# Patient Record
Sex: Female | Born: 1960 | Race: White | Hispanic: No | Marital: Single | Smoking: Former smoker
Health system: Southern US, Community
[De-identification: ages and names within clinical notes are randomized; demographics above are authoritative.]

## PROBLEM LIST (undated history)

## (undated) DIAGNOSIS — K200 Eosinophilic esophagitis: Secondary | ICD-10-CM

## (undated) DIAGNOSIS — E785 Hyperlipidemia, unspecified: Secondary | ICD-10-CM

## (undated) DIAGNOSIS — E538 Deficiency of other specified B group vitamins: Secondary | ICD-10-CM

## (undated) DIAGNOSIS — D510 Vitamin B12 deficiency anemia due to intrinsic factor deficiency: Secondary | ICD-10-CM

## (undated) DIAGNOSIS — E559 Vitamin D deficiency, unspecified: Secondary | ICD-10-CM

## (undated) DIAGNOSIS — Z8639 Personal history of other endocrine, nutritional and metabolic disease: Secondary | ICD-10-CM

## (undated) DIAGNOSIS — E063 Autoimmune thyroiditis: Secondary | ICD-10-CM

## (undated) DIAGNOSIS — Z1589 Genetic susceptibility to other disease: Secondary | ICD-10-CM

## (undated) DIAGNOSIS — K209 Esophagitis, unspecified without bleeding: Secondary | ICD-10-CM

## (undated) DIAGNOSIS — E618 Deficiency of other specified nutrient elements: Secondary | ICD-10-CM

## (undated) DIAGNOSIS — G43909 Migraine, unspecified, not intractable, without status migrainosus: Secondary | ICD-10-CM

## (undated) DIAGNOSIS — R87619 Unspecified abnormal cytological findings in specimens from cervix uteri: Secondary | ICD-10-CM

## (undated) HISTORY — DX: Personal history of other endocrine, nutritional and metabolic disease: Z86.39

## (undated) HISTORY — PX: TONSILLECTOMY AND ADENOIDECTOMY: SHX28

## (undated) HISTORY — DX: Deficiency of other specified nutrient elements: E61.8

## (undated) HISTORY — DX: Autoimmune thyroiditis: E06.3

## (undated) HISTORY — DX: Vitamin D deficiency, unspecified: E55.9

## (undated) HISTORY — DX: Deficiency of other specified B group vitamins: E53.8

## (undated) HISTORY — DX: Esophagitis, unspecified without bleeding: K20.90

## (undated) HISTORY — DX: Unspecified abnormal cytological findings in specimens from cervix uteri: R87.619

## (undated) HISTORY — DX: Eosinophilic esophagitis: K20.0

## (undated) HISTORY — DX: Genetic susceptibility to other disease: Z15.89

## (undated) HISTORY — DX: Migraine, unspecified, not intractable, without status migrainosus: G43.909

## (undated) HISTORY — DX: Vitamin B12 deficiency anemia due to intrinsic factor deficiency: D51.0

## (undated) HISTORY — PX: CATARACT EXTRACTION: SUR2

## (undated) HISTORY — DX: Hyperlipidemia, unspecified: E78.5

## (undated) HISTORY — PX: TONSILLECTOMY: SUR1361

---

## 2009-10-03 DIAGNOSIS — E063 Autoimmune thyroiditis: Secondary | ICD-10-CM | POA: Insufficient documentation

## 2017-06-05 DIAGNOSIS — G43009 Migraine without aura, not intractable, without status migrainosus: Secondary | ICD-10-CM | POA: Insufficient documentation

## 2020-08-24 ENCOUNTER — Other Ambulatory Visit: Payer: Self-pay

## 2020-08-24 ENCOUNTER — Ambulatory Visit (INDEPENDENT_AMBULATORY_CARE_PROVIDER_SITE_OTHER): Payer: 59 | Admitting: Obstetrics & Gynecology

## 2020-08-24 ENCOUNTER — Encounter: Payer: Self-pay | Admitting: Obstetrics & Gynecology

## 2020-08-24 ENCOUNTER — Other Ambulatory Visit (HOSPITAL_COMMUNITY)
Admission: RE | Admit: 2020-08-24 | Discharge: 2020-08-24 | Disposition: A | Discharge status: Home / Self Care | Payer: 59 | Source: Ambulatory Visit | Attending: Obstetrics & Gynecology | Admitting: Obstetrics & Gynecology

## 2020-08-24 VITALS — BP 89/58 | HR 68 | Ht 65.0 in | Wt 141.2 lb

## 2020-08-24 DIAGNOSIS — Z01419 Encounter for gynecological examination (general) (routine) without abnormal findings: Secondary | ICD-10-CM | POA: Diagnosis present

## 2020-08-24 DIAGNOSIS — Z1231 Encounter for screening mammogram for malignant neoplasm of breast: Secondary | ICD-10-CM | POA: Diagnosis not present

## 2020-08-24 NOTE — Progress Notes (Signed)
GYNECOLOGY ANNUAL PREVENTATIVE CARE ENCOUNTER NOTE  History:     Laura Byrd is a 60 y.o. [id number] female here for a routine annual gynecologic exam and to establish care. Just moved here from [place]. Current complaints: none.   Denies abnormal vaginal bleeding, discharge, pelvic pain, problems with intercourse or other gynecologic concerns.    Gynecologic History No LMP recorded. Patient is postmenopausal. LMP 08/12/2017.  Last Pap: 10/10/2019. Results were: normal with negative HPV. Desires repeat pap today, has different insurance. Last mammogram: 02/03/2020. Results were: normal  Obstetric History OB History  Gravida Para Term Preterm AB Living  1       1    SAB IAB Ectopic Multiple Live Births  1            # Outcome Date GA Lbr Len/2nd Weight Sex Delivery Anes PTL Lv  1 SAB             Past Medical History:  Diagnosis Date  . Abnormal Pap smear of cervix    Had two abnormal paps in 2015 and 2019, normal since  . Hashimoto's thyroiditis   . Migraines     Past Surgical History:  Procedure Laterality Date  . TONSILLECTOMY AND ADENOIDECTOMY      Current Outpatient Medications on File Prior to Visit  Medication Sig Dispense Refill  . levothyroxine (SYNTHROID) 100 MCG tablet Take 100 mcg by mouth daily before breakfast.    . SUMAtriptan (IMITREX) 50 MG tablet Take 50 mg by mouth every 2 (two) hours as needed for migraine. May repeat in 2 hours if headache persists or recurs.     No current facility-administered medications on file prior to visit.    Allergies  Allergen Reactions  . Penicillins Hives    Social History:  reports that she has quit smoking. She has never used smokeless tobacco. She reports previous alcohol use. She reports previous drug use.  Family History  Problem Relation Age of Onset  . Breast cancer Sister     The following portions of the patient's history were reviewed and updated as appropriate: allergies, current medications, past family  history, past medical history, past social history, past surgical history and problem list.  Review of Systems Pertinent items noted in HPI and remainder of comprehensive ROS otherwise negative.  Physical Exam:  BP (!) 89/58   Pulse 68   Ht 5\' 5"  (1.651 m)   Wt 141 lb 3.2 oz (64 kg)   BMI 23.50 kg/m  CONSTITUTIONAL: Well-developed, well-nourished female in no acute distress.  HENT:  Normocephalic, atraumatic, External right and left ear normal.  EYES: Conjunctivae and EOM are normal. Pupils are equal, round, and reactive to light. No scleral icterus.  NECK: Normal range of motion, supple, no masses.  Normal thyroid.  SKIN: Skin is warm and dry. No rash noted. Not diaphoretic. No erythema. No pallor. MUSCULOSKELETAL: Normal range of motion. No tenderness.  No cyanosis, clubbing, or edema. NEUROLOGIC: Alert and oriented to person, place, and time. Normal reflexes, muscle tone coordination.  PSYCHIATRIC: Normal mood and affect. Normal behavior. Normal judgment and thought content. CARDIOVASCULAR: Normal heart rate noted, regular rhythm RESPIRATORY: Clear to auscultation bilaterally. Effort and breath sounds normal, no problems with respiration noted. BREASTS: Symmetric in size. No masses, tenderness, skin changes, nipple drainage, or lymphadenopathy bilaterally. Performed in the presence of a chaperone. ABDOMEN: Soft, no distention noted.  No tenderness, rebound or guarding.  PELVIC: Normal appearing external genitalia and urethral meatus with moderate  atrophy; atrophic appearing vaginal mucosa and cervix. Cervix with prominent ectropion.  No abnormal discharge noted.  Pap smear obtained.  Normal uterine size, no other palpable masses, no uterine or adnexal tenderness.  Performed in the presence of a chaperone.   Assessment and Plan:    1. Well woman exam with routine gynecological exam - Cytology - PAP Will follow up results of pap smear and manage accordingly.  2. Breast cancer  screening by mammogram - MM 3D SCREEN BREAST BILATERAL; Future Mammogram is up to date, patient to schedule next one via MyChart after 02/2021 Routine preventative health maintenance measures emphasized. Please refer to After Visit Summary for other counseling recommendations.      Jaynie Collins, MD, FACOG Obstetrician & Gynecologist, [facility] for [employer], [facility]

## 2020-08-24 NOTE — Patient Instructions (Signed)
Preventive Care 59-59 Years Old, Female Preventive care refers to lifestyle choices and visits with your health care provider that can promote health and wellness. This includes:  A yearly physical exam. This is also called an annual wellness visit.  Regular dental and eye exams.  Immunizations.  Screening for certain conditions.  Healthy lifestyle choices, such as: ? Eating a healthy diet. ? Getting regular exercise. ? Not using drugs or products that contain nicotine and tobacco. ? Limiting alcohol use. What can I expect for my preventive care visit? Physical exam Your health care provider will check your:  Height and weight. These may be used to calculate your BMI (body mass index). BMI is a measurement that tells if you are at a healthy weight.  Heart rate and blood pressure.  Body temperature.  Skin for abnormal spots. Counseling Your health care provider may ask you questions about your:  Past medical problems.  Family's medical history.  Alcohol, tobacco, and drug use.  Emotional well-being.  Home life and relationship well-being.  Sexual activity.  Diet, exercise, and sleep habits.  Work and work Statistician.  Access to firearms.  Method of birth control.  Menstrual cycle.  Pregnancy history. What immunizations do I need? Vaccines are usually given at various ages, according to a schedule. Your health care provider will recommend vaccines for you based on your age, medical history, and lifestyle or other factors, such as travel or where you work.   What tests do I need? Blood tests  Lipid and cholesterol levels. These may be checked every 5 years, or more often if you are over 3 years old.  Hepatitis C test.  Hepatitis B test. Screening  Lung cancer screening. You may have this screening every year starting at age 73 if you have a 30-pack-year history of smoking and currently smoke or have quit within the past 15 years.  Colorectal cancer  screening. ? All adults should have this screening starting at age 52 and continuing until age 17. ? Your health care provider may recommend screening at age 49 if you are at increased risk. ? You will have tests every 1-10 years, depending on your results and the type of screening test.  Diabetes screening. ? This is done by checking your blood sugar (glucose) after you have not eaten for a while (fasting). ? You may have this done every 1-3 years.  Mammogram. ? This may be done every 1-2 years. ? Talk with your health care provider about when you should start having regular mammograms. This may depend on whether you have a family history of breast cancer.  BRCA-related cancer screening. This may be done if you have a family history of breast, ovarian, tubal, or peritoneal cancers.  Pelvic exam and Pap test. ? This may be done every 3 years starting at age 10. ? Starting at age 11, this may be done every 5 years if you have a Pap test in combination with an HPV test. Other tests  STD (sexually transmitted disease) testing, if you are at risk.  Bone density scan. This is done to screen for osteoporosis. You may have this scan if you are at high risk for osteoporosis. Talk with your health care provider about your test results, treatment options, and if necessary, the need for more tests. Follow these instructions at home: Eating and drinking  Eat a diet that includes fresh fruits and vegetables, whole grains, lean protein, and low-fat dairy products.  Take vitamin and mineral supplements  as recommended by your health care provider.  Do not drink alcohol if: ? Your health care provider tells you not to drink. ? You are pregnant, may be pregnant, or are planning to become pregnant.  If you drink alcohol: ? Limit how much you have to 0-1 drink a day. ? Be aware of how much alcohol is in your drink. In the [place] one drink equals one 12 oz bottle of beer (355 mL), one 5 oz glass of  wine (148 mL), or one 1 oz glass of hard liquor (44 mL).   Lifestyle  Take daily care of your teeth and gums. Brush your teeth every morning and night with fluoride toothpaste. Floss one time each day.  Stay active. Exercise for at least 30 minutes 5 or more days each week.  Do not use any products that contain nicotine or tobacco, such as cigarettes, e-cigarettes, and chewing tobacco. If you need help quitting, ask your health care provider.  Do not use drugs.  If you are sexually active, practice safe sex. Use a condom or other form of protection to prevent STIs (sexually transmitted infections).  If you do not wish to become pregnant, use a form of birth control. If you plan to become pregnant, see your health care provider for a prepregnancy visit.  If told by your health care provider, take low-dose aspirin daily starting at age 50.  Find healthy ways to cope with stress, such as: ? Meditation, yoga, or listening to music. ? Journaling. ? Talking to a trusted person. ? Spending time with friends and family. Safety  Always wear your seat belt while driving or riding in a vehicle.  Do not drive: ? If you have been drinking alcohol. Do not ride with someone who has been drinking. ? When you are tired or distracted. ? While texting.  Wear a helmet and other protective equipment during sports activities.  If you have firearms in your house, make sure you follow all gun safety procedures. What's next?  Visit your health care provider once a year for an annual wellness visit.  Ask your health care provider how often you should have your eyes and teeth checked.  Stay up to date on all vaccines. This information is not intended to replace advice given to you by your health care provider. Make sure you discuss any questions you have with your health care provider. Document Revised: 01/24/2020 Document Reviewed: 12/31/2017 Elsevier Patient Education  2021 Elsevier Inc.  

## 2020-08-30 LAB — CYTOLOGY - PAP
Comment: NEGATIVE
Diagnosis: NEGATIVE
High risk HPV: NEGATIVE

## 2020-09-10 ENCOUNTER — Other Ambulatory Visit: Payer: Self-pay | Admitting: Obstetrics & Gynecology

## 2020-09-10 DIAGNOSIS — Z1231 Encounter for screening mammogram for malignant neoplasm of breast: Secondary | ICD-10-CM

## 2021-01-25 DIAGNOSIS — Z1231 Encounter for screening mammogram for malignant neoplasm of breast: Secondary | ICD-10-CM

## 2021-02-08 ENCOUNTER — Ambulatory Visit
Admission: RE | Admit: 2021-02-08 | Discharge: 2021-02-08 | Disposition: A | Discharge status: Home / Self Care | Payer: 59 | Source: Ambulatory Visit

## 2021-02-08 ENCOUNTER — Other Ambulatory Visit: Payer: Self-pay

## 2021-02-08 DIAGNOSIS — Z1231 Encounter for screening mammogram for malignant neoplasm of breast: Secondary | ICD-10-CM

## 2021-05-10 DIAGNOSIS — L578 Other skin changes due to chronic exposure to nonionizing radiation: Secondary | ICD-10-CM | POA: Diagnosis not present

## 2021-05-10 DIAGNOSIS — L400 Psoriasis vulgaris: Secondary | ICD-10-CM | POA: Diagnosis not present

## 2021-05-10 DIAGNOSIS — L814 Other melanin hyperpigmentation: Secondary | ICD-10-CM | POA: Diagnosis not present

## 2021-05-10 DIAGNOSIS — D225 Melanocytic nevi of trunk: Secondary | ICD-10-CM | POA: Diagnosis not present

## 2021-05-10 DIAGNOSIS — D485 Neoplasm of uncertain behavior of skin: Secondary | ICD-10-CM | POA: Diagnosis not present

## 2021-06-21 DIAGNOSIS — E785 Hyperlipidemia, unspecified: Secondary | ICD-10-CM | POA: Diagnosis not present

## 2021-06-21 DIAGNOSIS — Z6822 Body mass index (BMI) 22.0-22.9, adult: Secondary | ICD-10-CM | POA: Diagnosis not present

## 2021-06-21 DIAGNOSIS — Z79899 Other long term (current) drug therapy: Secondary | ICD-10-CM | POA: Diagnosis not present

## 2021-06-21 DIAGNOSIS — G43909 Migraine, unspecified, not intractable, without status migrainosus: Secondary | ICD-10-CM | POA: Diagnosis not present

## 2021-06-21 DIAGNOSIS — E559 Vitamin D deficiency, unspecified: Secondary | ICD-10-CM | POA: Diagnosis not present

## 2021-06-21 DIAGNOSIS — E039 Hypothyroidism, unspecified: Secondary | ICD-10-CM | POA: Diagnosis not present

## 2021-08-30 ENCOUNTER — Other Ambulatory Visit (HOSPITAL_COMMUNITY)
Admission: RE | Admit: 2021-08-30 | Discharge: 2021-08-30 | Disposition: A | Discharge status: Home / Self Care | Payer: 59 | Source: Ambulatory Visit

## 2021-08-30 ENCOUNTER — Ambulatory Visit (INDEPENDENT_AMBULATORY_CARE_PROVIDER_SITE_OTHER): Payer: 59

## 2021-08-30 VITALS — BP 100/61 | HR 74 | Ht 65.0 in | Wt 134.3 lb

## 2021-08-30 DIAGNOSIS — N952 Postmenopausal atrophic vaginitis: Secondary | ICD-10-CM

## 2021-08-30 DIAGNOSIS — Z01419 Encounter for gynecological examination (general) (routine) without abnormal findings: Secondary | ICD-10-CM

## 2021-08-30 DIAGNOSIS — Z1239 Encounter for other screening for malignant neoplasm of breast: Secondary | ICD-10-CM | POA: Diagnosis not present

## 2021-08-30 DIAGNOSIS — Z124 Encounter for screening for malignant neoplasm of cervix: Secondary | ICD-10-CM | POA: Insufficient documentation

## 2021-08-30 NOTE — Progress Notes (Signed)
? ? ?GYNECOLOGY OFFICE VISIT NOTE-WELL WOMAN EXAM ? ?History:  ? ALDONIA Byrd [id number] here today for annual exam. She is postmenopausal with LMP April 2019. She is not currently sexually active and denies any abnormal vaginal discharge, bleeding, or pelvic pain.  She reports some urinary urgency, but feels she is coping well as it does not interfere into her daily life.  She denies other issues with urination, constipation, or diarrhea..  Patient reports completing breast exams, but not regularly.  She denies current issues with her breast.  She does endorse family history as her sister had breast cancer. ? ?Laura Byrd reports daily exercise in the form of gardening and landscaping.  She also does yoga and walks her dog.  She denies current usage of tobacco, alcohol, or drugs.  She further states that alcohol usage gives her migraines.  ? ?Laura Byrd reports medical history significant for Hashimoto's, Migraines, and Esophagitis. She is currently being followed by her PCP, O'Buch, and was seen in Feb with her next appt in July. ?She endorses restricted, but overall balanced diet.  She reports she does not eat fish, meats, gluten, dairy, soy, treenuts, or eggs. ? 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  She endorse safety and has a "small, but solid" support system.  ? ?Past Medical History:  ?Diagnosis Date  ? Abnormal Pap smear of cervix   ? Had two abnormal paps in 2015 and 2019, normal since  ? Hashimoto's thyroiditis   ? Migraines   ? ? ?Past Surgical History:  ?Procedure Laterality Date  ? TONSILLECTOMY AND ADENOIDECTOMY    ? ? ?The following portions of the patient's history were reviewed and updated as appropriate: allergies, current medications, past family history, past medical history, past social history, past surgical history and problem list.  ? ?Health Maintenance:  Normal pap and negative HRHPV on April 2022.  Normal mammogram in Oct 2022.  ? ?Review of Systems:   ?Pertinent items noted in HPI and remainder of comprehensive ROS otherwise negative.   ? ?Objective:  ?  ?Physical Exam ?BP 100/61   Pulse 74   Ht 5\' 5"  (1.651 m)   Wt 134 lb 4.8 oz (60.9 kg)   BMI 22.35 kg/m?  ?Physical Exam ?Exam conducted with a chaperone present.  ?Constitutional:   ?   Appearance: Normal appearance.  ?HENT:  ?   Head: Normocephalic and atraumatic.  ?Eyes:  ?   Conjunctiva/sclera: Conjunctivae normal.  ?Cardiovascular:  ?   Rate and Rhythm: Normal rate and regular rhythm.  ?Pulmonary:  ?   Effort: Pulmonary effort is normal. No respiratory distress.  ?   Breath sounds: Normal breath sounds.  ?Chest:  ?Breasts: ?   Right: No mass or nipple discharge.  ?   Left: Normal.  ?   Comments: CBE complete. Patient reports sensitivity in her right breast near the nipple.  ?Abdominal:  ?   General: Bowel sounds are normal.  ?   Palpations: Abdomen is soft.  ?   Tenderness: There is no abdominal tenderness.  ?Genitourinary: ?   Labia:     ?   Right: No tenderness or lesion.     ?   Left: No tenderness or lesion.   ?   Vagina: No vaginal discharge, erythema or bleeding.  ?   Cervix: No discharge or friability.  ?   Comments: NEFG  ?Speculum exam: Vaginal walls smooth, light pink/white, and without lesions/bleeding. ?Cervix appears pinkish white. No discharge.  Pap collected with brush and spatula. Os appears stenotic.  ?Musculoskeletal:     ?   General: Normal range of motion.  ?   Cervical back: Normal range of motion.  ?Skin: ?   General: Skin is warm and dry.  ?Neurological:  ?   Mental Status: She is alert and oriented to person, place, and time.  ?Psychiatric:     ?   Mood and Affect: Mood normal.     ?   Behavior: Behavior normal.  ?  ? ?Labs and Imaging ?No results found for this or any previous visit (from the past 168 hour(s)). ?No results found. ?  ?Assessment & Plan:  ?61 year old Female ?Well Woman Exam ?Desires Pap Smear d/t remote H/O abnormal cytology ?Breast Exam ? ?1. Well woman exam with  routine gynecological exam ?-Exam performed and findings discussed. ?-Encouraged to utilize Mychart for reviewing of results, communication with office, and scheduling of appts. ?-Educated on AHA exercise recommendations of 30 minutes of moderate to vigorous activity at least 5x/week. ? ?2. Pap smear for cervical cancer screening ?-Educated on ASCCP guidelines regarding pap smear evaluation and frequency. ?-Informed that she does not require repeat testing today.  ?-Patient expresses desire for repeat pap despite guidelines reporting history of abnormal cytology in the past. ?-Informed that provider understands and cytology can be repeated today.   ?-Pap with cotest completed ?-Informed of turnover time and provider/clinic policy on releasing results. ? ?3. Vaginal atrophy ?-Discussed vaginal atrophy  ?-Reassured that normal findings in postmenopausal women.  ?-Informed no treatment unless symptoms, such as, pain or discomfort with sexual activity arise.  ? ?3. Encounter for screening breast examination ?-Educated and encouraged to continue SBE with increased breast awareness including examination of breast for skin changes, moles, tenderness, etc.  ?-Order placed for mammogram to occur in October. ?-Encouraged to monitor area of sensitivity and report if changes occur.  ? ?Routine preventative health maintenance measures emphasized. ?Please refer to After Visit Summary for other counseling recommendations.  ? ?No follow-ups on file. ?  ?  ? ?Laura Byrd, CNM ?08/30/2021  ? ? ? ? ?

## 2021-08-30 NOTE — Progress Notes (Signed)
Mammo: 02/08/2021 ?

## 2021-09-03 LAB — CYTOLOGY - PAP
Comment: NEGATIVE
Diagnosis: NEGATIVE
High risk HPV: NEGATIVE

## 2021-10-25 DIAGNOSIS — D225 Melanocytic nevi of trunk: Secondary | ICD-10-CM | POA: Diagnosis not present

## 2021-10-25 DIAGNOSIS — L400 Psoriasis vulgaris: Secondary | ICD-10-CM | POA: Diagnosis not present

## 2021-10-25 DIAGNOSIS — D485 Neoplasm of uncertain behavior of skin: Secondary | ICD-10-CM | POA: Diagnosis not present

## 2021-12-08 DIAGNOSIS — Z681 Body mass index (BMI) 19 or less, adult: Secondary | ICD-10-CM | POA: Diagnosis not present

## 2021-12-08 DIAGNOSIS — R21 Rash and other nonspecific skin eruption: Secondary | ICD-10-CM | POA: Diagnosis not present

## 2021-12-17 DIAGNOSIS — E538 Deficiency of other specified B group vitamins: Secondary | ICD-10-CM | POA: Diagnosis not present

## 2021-12-17 DIAGNOSIS — Z6821 Body mass index (BMI) 21.0-21.9, adult: Secondary | ICD-10-CM | POA: Diagnosis not present

## 2021-12-17 DIAGNOSIS — E559 Vitamin D deficiency, unspecified: Secondary | ICD-10-CM | POA: Diagnosis not present

## 2021-12-17 DIAGNOSIS — E039 Hypothyroidism, unspecified: Secondary | ICD-10-CM | POA: Diagnosis not present

## 2021-12-17 DIAGNOSIS — Z0000 Encounter for general adult medical examination without abnormal findings: Secondary | ICD-10-CM | POA: Diagnosis not present

## 2021-12-18 ENCOUNTER — Other Ambulatory Visit: Payer: Self-pay | Admitting: Internal Medicine

## 2021-12-18 DIAGNOSIS — Z13820 Encounter for screening for osteoporosis: Secondary | ICD-10-CM

## 2022-01-16 ENCOUNTER — Encounter: Payer: Self-pay | Admitting: *Deleted

## 2022-01-17 ENCOUNTER — Ambulatory Visit: Payer: 59 | Admitting: Psychiatry

## 2022-01-17 ENCOUNTER — Encounter: Payer: Self-pay | Admitting: Psychiatry

## 2022-01-17 VITALS — BP 96/60 | HR 72 | Ht 65.0 in | Wt 129.0 lb

## 2022-01-17 DIAGNOSIS — G43019 Migraine without aura, intractable, without status migrainosus: Secondary | ICD-10-CM

## 2022-01-17 MED ORDER — UBRELVY 100 MG PO TABS: 100.0000 mg | ORAL_TABLET | ORAL | 0 refills | Status: DC | PRN

## 2022-01-17 NOTE — Progress Notes (Signed)
Referring:  Eunice Blase, PA-C [address] [place],  [place] [address]  PCP: Eunice Blase, PA-C  Neurology was asked to evaluate Laura Byrd, a 61 year old female for a chief complaint of headaches.  Our recommendations of care will be communicated by shared medical record.    CC:  headaches  History provided from self  HPI:  Medical co-morbidities: Hashimoto's, B12 deficiency, Fuch's disease  The patient presents for evaluation of headaches which began in 2011. She averages 1-2 migraines per week. They are described as right retro-orbital aching with associated photophobia, phonophobia, and nausea. They can last 3-4 days without medication. Takes Imitrex 100 mg PRN for rescue, which helps but she will sometimes run out of medication. She will sometimes have to repeat a dose after 2 hours. Takes gabapentin 100 mg TID for headache prevention.  Headache History: Onset: 2011 Triggers: alcohol, dairy, soy, hard candy, lack of sleep, weather changes Aura: no Location: right retro-orbital Quality/Description: aching Associated Symptoms:  Photophobia: yes  Phonophobia: yes  Nausea: yes Vomiting: yes Worse with activity?: yes Duration of headaches: 3-4 days  Headache days per month: 8 Headache free days per month: 22  Current Treatment: Abortive Imitrex 100 mg PRN  Preventative Gabapentin 100 mg TID  Prior Therapies                                 Gabapentin 100 mg TID Imitrex 100 mg PRN Zomig 5 mg PRN   LABS: none  IMAGING:  Reportedly had normal MRI brain in 2019  Current Outpatient Medications on File Prior to Visit  Medication Sig Dispense Refill   gabapentin (NEURONTIN) 100 MG capsule Take 100 mg by mouth 3 (three) times daily.     levothyroxine (SYNTHROID) 50 MCG tablet Take 25-50 mcg by mouth daily. Takes 1 tablet on 1 day and 1/2 tablet on other 6 days of the week     liothyronine (CYTOMEL) 5 MCG tablet Take 5-10 mcg by mouth daily. 7.5 mcg  on 4 days a week and 5 mcg on 3 days of the week     SUMAtriptan (IMITREX) 100 MG tablet Take 100 mg by mouth as directed.     No current facility-administered medications on file prior to visit.     Allergies: Allergies  Allergen Reactions   Other     Milk, nuts, egg white, wheat   Penicillins Hives    Family History: Migraine or other headaches in the family:  niece Aneurysms in a first degree relative:  no Brain tumors in the family:  no Other neurological illness in the family:   lewy body dementia  Past Medical History: Past Medical History:  Diagnosis Date   Abnormal Pap smear of cervix    Had two abnormal paps in 2015 and 2019, normal since   B12 deficiency    Dyslipidemia    Eosinophilic esophagitis    Hashimoto's thyroiditis    History of vitamin D deficiency    Iodine deficiency    Migraines    MTHFR mutation    Pernicious anemia    Vitamin D3 deficiency     Past Surgical History Past Surgical History:  Procedure Laterality Date   CATARACT EXTRACTION     TONSILLECTOMY     TONSILLECTOMY AND ADENOIDECTOMY     pt doesn't think her adenoids were removed    Social History: Social History   Tobacco Use  Smoking status: Former    Types: Cigarettes    Quit date: 05/06/1987    Years since quitting: 34.7   Smokeless tobacco: Never  Vaping Use   Vaping Use: Never used  Substance Use Topics   Alcohol use: Not Currently   Drug use: Never    ROS: Negative for fevers, chills. Positive for headaches. All other systems reviewed and negative unless stated otherwise in HPI.   Physical Exam:   Vital Signs: BP 96/60 (BP Location: Right Arm, Patient Position: Sitting)   Pulse 72   Ht 5\' 5"  (1.651 m)   Wt 129 lb (58.5 kg)   BMI 21.47 kg/m  GENERAL: well appearing,in no acute distress,alert SKIN:  Color, texture, turgor normal. No rashes or lesions HEAD:  Normocephalic/atraumatic. CV:  RRR RESP: Normal respiratory effort MSK: no tenderness to  palpation over occiput, neck, or shoulders  NEUROLOGICAL: Mental Status: Alert, oriented to person, place and time,Follows commands Cranial Nerves: PERRL, visual fields intact to confrontation, extraocular movements intact, facial sensation intact, no facial droop or ptosis, hearing grossly intact, no dysarthria Motor: muscle strength 5/5 both upper and lower extremities,no drift, normal tone Reflexes: 2+ throughout Sensation: intact to light touch all 4 extremities Coordination: Finger-to- nose-finger intact bilaterally Gait: normal-based   IMPRESSION: 61 year old female with a history of Hashimoto's, B12 deficiency, Fuch's disease who present for evaluation of migraines. Imitrex helps for rescue, but she has found herself having to take a second dose more frequently. Ubrelvy sample provided today. If this is effective will send in a prescription. Discussed preventive options. She would like to try to wean off of gabapentin. Discussed supplement options for prevention (Mg, B2, CoQ10).  PLAN: -Prevention: Wean off gabapentin. Supplement information provided (Mg, B2, CoQ10) -Rescue: Continue Imitrex 100 mg PRN for now. Ubrelvy sample provided. Will send in Rx if this is effective   I spent a total of 32 minutes chart reviewing and counseling the patient. Headache education was done. Discussed treatment options including preventive and acute medications, and natural supplements. Discussed medication side effects, adverse reactions and drug interactions. Written educational materials and patient instructions outlining all of the above were given.  Follow-up: 6 months   68, MD 01/17/2022   10:25 AM

## 2022-01-17 NOTE — Patient Instructions (Addendum)
Try Bernita Raisin as needed for migraines. Take one pill at onset of migraine. May repeat a dose in 2 hours if headache persists.  Try weaning off gabapentin. Take 1 pill twice a day for one week, then 1 pill at bedtime for one week, then stop  GENERAL HEADACHE INSTRUCTIONS Headache Preventive Treatment: Please keep in mind that it takes 4-6 weeks for the medication to start working well and 2-3 months at the appropriate dose before deciding if it will be useful or not. If it is not helping at all by this time, then we will discuss other medications to try. Supplements may take 3-6 months until you see full effect.   Natural supplements: Magnesium Oxide or Magnesium Glycinate 500 mg at bed (up to 800 mg daily) Coenzyme Q10 300 mg in AM Vitamin B2- 200 mg twice a day  Add 1 supplement at a time since even natural supplements can have undesirable side effects. You can sometimes buy supplements cheaper (especially Coenzyme Q10) at www.WebmailGuide.co.za or at [employer].  Vitamins and herbs that show potential:  Magnesium: Magnesium (250 mg twice a day or 500 mg at bed) has a relaxant effect on smooth muscles such as blood vessels. Individuals suffering from frequent or daily headache usually have low magnesium levels which can be increase with daily supplementation of 400-750 mg. Three trials found 40-90% average headache reduction  when used as a preventative. Magnesium also demonstrated the benefit in menstrually related migraine.  Magnesium is part of the messenger system in the serotonin cascade and it is a good muscle relaxant.  It is also useful for constipation which can be a side effect of other medications used to treat migraine. Good sources include nuts, whole grains, and tomatoes. Side Effects: loose stool/diarrhea Riboflavin (vitamin B 2) 200 mg twice a day. This vitamin assists nerve cells in the production of ATP a principal energy storing molecule.  It is necessary for many chemical reactions in the  body.  There have been at least 3 clinical trials of riboflavin using 400 mg per day all of which suggested that migraine frequency can be decreased.  All 3 trials showed significant improvement in over half of migraine sufferers.  The supplement is found in bread, cereal, milk, meat, and poultry.  Most Americans get more riboflavin than the recommended daily allowance, however riboflavin deficiency is not necessary for the supplements to help prevent headache. Side effects: energizing, green urine  Coenzyme Q10: This is present in almost all cells in the body and is critical component for the conversion of energy.  Recent studies have shown that a nutritional supplement of CoQ10 can reduce the frequency of migraine attacks by improving the energy production of cells as with riboflavin.  Doses of 150 mg twice a day have been shown to be effective.   HEADACHE DIET: Foods and beverages which may trigger migraine Note that only 20% of headache patients are food sensitive. You will know if you are food sensitive if you get a headache consistently 20 minutes to 2 hours after eating a certain food. Only cut out a food if it causes headaches, otherwise you might remove foods you enjoy! What matters most for diet is to eat a well balanced healthy diet full of vegetables and low fat protein, and to not miss meals.  Chocolate, other sweets ALL cheeses except cottage and cream cheese Dairy products, yogurt, sour cream, ice cream Liver Meat extracts (Bovril, Marmite, meat tenderizers) Meats or fish which have undergone aging, fermenting,  pickling or smoking. These include: Hotdogs,salami,Lox,sausage, mortadellas,smoked salmon, pepperoni, Pickled herring Pods of broad bean (English beans, Chinese pea pods, [place] (fava) beans, lima and navy beans Ripe avocado, ripe banana Yeast extracts or active yeast preparations such as Brewer's or Fleishman's (commercial bakes goods are permitted) Tomato based foods, pizza  (lasagna, etc.)  MSG (monosodium glutamate) is disguised as many things; look for these common aliases: Monopotassium glutamate Autolysed yeast Hydrolysed protein Sodium caseinate "flavorings" "all natural preservatives" Nutrasweet  Avoid all other foods that convincingly provoke headaches.  Resources: The Dizzy Lu Duffel Your Headache Diet, migrainestrong.com  https://www.aguirre.org/  Caffeine and Migraine For patients that have migraine, caffeine intake more than 3 days per week can lead to dependency and increased migraine frequency. I would recommend cutting back on your caffeine intake as best you can. The recommended amount of caffeine is 200-300 mg daily, although migraine patients may experience dependency at even lower doses. While you may notice an increase in headache temporarily, cutting back will be helpful for headaches in the long run. For more information on caffeine and migraine, visit: https://americanmigrainefoundation.org/resource-library/caffeine-and-migraine/  Headache Prevention Strategies:  1. Maintain a headache diary; learn to identify and avoid triggers.  - This can be a simple note where you log when you had a headache, associated symptoms, and medications used - There are several smartphone apps developed to help track migraines: Migraine Buddy, Migraine Monitor, Curelator N1-Headache App  Common triggers include: Emotional triggers: Emotional/Upset family or friends Emotional/Upset occupation Business reversal/success Anticipation anxiety Crisis-serious Post-crisis periodNew job/position   Physical triggers: Vacation Day Weekend Strenuous Exercise High Altitude Location New Move Menstrual Day Physical Illness Oversleep/Not enough sleep Weather changes Light: Photophobia or light sesnitivity treatment involves a balance between desensitization and reduction in overly strong input. Use dark  polarized glasses outside, but not inside. Avoid bright or fluorescent light, but do not dim environment to the point that going into a normally lit room hurts. Consider FL-41 tint lenses, which reduce the most irritating wavelengths without blocking too much light.  These can be obtained at axonoptics.com or theraspecs.com Foods: see list above.  2. Limit use of acute treatments (over-the-counter medications, triptans, etc.) to no more than 2 days per week or 10 days per month to prevent medication overuse headache (rebound headache).    3. Follow a regular schedule (including weekends and holidays): Don't skip meals. Eat a balanced diet. 8 hours of sleep nightly. Minimize stress. Exercise 30 minutes per day. Being overweight is associated with a 5 times increased risk of chronic migraine. Keep well hydrated and drink 6-8 glasses of water per day.  4. Initiate non-pharmacologic measures at the earliest onset of your headache. Rest and quiet environment. Relax and reduce stress. Breathe2Relax is a free app that can instruct you on    some simple relaxtion and breathing techniques. Http://Dawnbuse.com is a    free website that provides teaching videos on relaxation.  Also, there are  many apps that   can be downloaded for "mindful" relaxation.  An app called YOGA NIDRA will help walk you through mindfulness. Another app called Calm can be downloaded to give you a structured mindfulness guide with daily reminders and skill development. Headspace for guided meditation Mindfulness Based Stress Reduction Online Course: www.palousemindfulness.com Cold compresses.  5. Don't wait!! Take the maximum allowable dosage of prescribed medication at the first sign of migraine.  6. Compliance:  Take prescribed medication regularly as directed and at the first sign of a migraine.  7. Communicate:  Call your physician when problems arise, especially if your headaches change, increase in frequency/severity, or  become associated with neurological symptoms (weakness, numbness, slurred speech, etc.).  8. Headache/pain management therapies: Consider various complementary methods, including medication, behavioral therapy, psychological counselling, biofeedback, massage therapy, acupuncture, dry needling, and other modalities.  Such measures may reduce the need for medications. Counseling for pain management, where patients learn to function and ignore/minimize their pain, seems to work very well.  9. Recommend changing family's attention and focus away from patient's headaches. Instead, emphasize daily activities. If first question of day is 'How are your headaches/Do you have a headache today?', then patient will constantly think about headaches, thus making them worse. Goal is to re-direct attention away from headaches, toward daily activities and other distractions.  10. Helpful Websites: www.AmericanHeadacheSociety.org PatentHood.ch www.headaches.org TightMarket.nl www.achenet.org  11. HEADACHE EXPECTATIONS: There are many types of headaches, and only a rare few in which complete relief can be expected.  In general, there is no cure for headache, especially migraine based headaches.  There is nothing available that completely prevents headaches from occurring, breaking through, or having periodic flare-ups and fluctuations.  Regardless of what you are using on a daily basis for prevention, episodic headaches should still be expected, and periods where frequency may escalate and fluctuate are unavoidable.   There is no quick fix for most headaches.  Furthermore, the longer you have had high frequency headaches (such as chronic daily headache), the longer it will likely take to expect any improvement.  In fact, some people will never improve, regardless of how many medications or other treatments we try.  Our treatment strategy is to evaluate for possible causes of your headache, although testing  is usually always normal, even in cases of daily continuous headaches for years.  Most types of headache such as migraine are electrical brain disorders (similar to how epilepsy is an electrical brain disorders).  Therefore, there is no testing that will reveal this "dysfunctional electrical circuitry" such on MRI, or other testing.  We try to find a medication that may help lessen the frequency and/or severity of your headaches.  The goal is not to completely stop them from happening, although if that happens, great!  Different people respond to different medications, and some people just don't respond to anything, so it's usually a matter of trying different options.  We cannot predict if or when exactly you will respond to a treatment that we provide.   Preventive headache medications take 4-6 weeks to start working, and 2-3 months to see full effect, assuming you reach an effective dose.  Therefore, calling or messaging frequently because you have a headache flare prior to the 3 month mark is unlikely to change anything, and unfortunately there is nothing available that will expedite this, so please try to avoid this.  Our recommendation will generally be to give it adequate time first.  If you are unable to wait it out for medications to work, we can also try IV infusions for some temporary relief.     In general, the best that preventive medications or other treatments (including Botox) are able to offer in migraine management (variable in other headache types) is a 50% improvement in frequency and/or severity of headache.  That is our goal, and any additional benefit is considered a bonus.  Some people do significantly better than this, others do not get close to this.  Therefore, if your headaches are not improving by at least 3 months on  your preventive strategy, contact us and we can discuss further adjustments.  Keep in mind that complete headache cure is not a realistic expectation.

## 2022-02-14 ENCOUNTER — Ambulatory Visit
Admission: RE | Admit: 2022-02-14 | Discharge: 2022-02-14 | Disposition: A | Discharge status: Home / Self Care | Payer: 59 | Source: Ambulatory Visit

## 2022-02-14 DIAGNOSIS — Z1239 Encounter for other screening for malignant neoplasm of breast: Secondary | ICD-10-CM

## 2022-02-14 DIAGNOSIS — Z01419 Encounter for gynecological examination (general) (routine) without abnormal findings: Secondary | ICD-10-CM

## 2022-02-14 DIAGNOSIS — Z1231 Encounter for screening mammogram for malignant neoplasm of breast: Secondary | ICD-10-CM | POA: Diagnosis not present

## 2022-03-21 DIAGNOSIS — M810 Age-related osteoporosis without current pathological fracture: Secondary | ICD-10-CM | POA: Diagnosis not present

## 2022-03-21 DIAGNOSIS — Z13820 Encounter for screening for osteoporosis: Secondary | ICD-10-CM | POA: Diagnosis not present

## 2022-04-11 ENCOUNTER — Encounter: Payer: Self-pay | Admitting: Psychiatry

## 2022-04-14 ENCOUNTER — Other Ambulatory Visit: Payer: Self-pay

## 2022-04-14 MED ORDER — UBRELVY 100 MG PO TABS: 100.0000 mg | ORAL_TABLET | ORAL | 5 refills | Status: DC | PRN

## 2022-04-15 ENCOUNTER — Telehealth: Payer: Self-pay | Admitting: Neurology

## 2022-04-15 NOTE — Telephone Encounter (Signed)
PA completed on C[facility]/ [employer] [id number]

## 2022-04-16 NOTE — Telephone Encounter (Signed)
PA approved for the patient for Ubrelvy through [facility] [employer] until 04/17/2023

## 2022-05-16 DIAGNOSIS — L814 Other melanin hyperpigmentation: Secondary | ICD-10-CM | POA: Diagnosis not present

## 2022-05-16 DIAGNOSIS — L739 Follicular disorder, unspecified: Secondary | ICD-10-CM | POA: Diagnosis not present

## 2022-05-16 DIAGNOSIS — L821 Other seborrheic keratosis: Secondary | ICD-10-CM | POA: Diagnosis not present

## 2022-05-16 DIAGNOSIS — D225 Melanocytic nevi of trunk: Secondary | ICD-10-CM | POA: Diagnosis not present

## 2022-07-21 NOTE — Progress Notes (Unsigned)
   Virtual Visit via Video Note  I connected with Laura Byrd on 07/22/22 at  8:30 AM EDT by a video enabled telemedicine application and verified that I am speaking with the correct person using two identifiers.  Location: Patient: home Provider: office   I discussed the limitations of evaluation and management by telemedicine and the availability of in person appointments. The patient expressed understanding and agreed to proceed.   CC:  headaches  Follow-up Visit  Last visit: 01/17/22  Brief HPI: 62 year old female with a history of Hashimoto's, B12 deficiency, Fuch's disease, osteoporosis who follows in clinic for migraines.  At her last visit she was started on Ubrelvy for rescue. She was weaned off of gabapentin. Supplement options were discussed.  Interval History: Headaches are slightly better than her last visit. She is averaging 1-2 migraines per week. [name] works well for rescue, but she has not needed to take it frequently as her migraines have not been very severe. Magnesium causes diarrhea so she cannot take this every day, but she takes 200 mg 3 days per week. She also takes a B complex vitamin. She has weaned off of gabapentin.   Migraine days per month: 4 Headache free days per month: 26  Current Headache Regimen: Preventative: none Abortive: Ubrelvy 100 mg PRN   Prior Therapies                                  Gabapentin 100 mg TID Imitrex 100 mg PRN Zomig 5 mg PRN Ubrelvy 100 mg PRN    Physical Exam:  GENERAL:  well appearing, in no acute distress, alert  HEAD:  Normocephalic/atraumatic.  NEUROLOGICAL: Mental Status: Alert, oriented to person, place and time, Follows commands, and Speech fluent and appropriate. Cranial Nerves: face symmetric, no dysarthria, hearing grossly intact Motor: moves all extremities equally  IMPRESSION: 62 year old female with a history of Hashimoto's, B12 deficiency, Fuch's disease, osteoporosis who presents for  follow up of migraines. Headaches are slightly better since her last visit and she was able to wean off of gabapentin. [name] works well for rescue. She would prefer not to start a preventive medication at this time. Will continue current regimen for now.  PLAN: -Rescue: Continue Ubrelvy 100 mg PRN  Follow-up: 1 year  I spent a total of 19 minutes on the date of the service. Headache education was done. Discussed medication side effects, adverse reactions and drug interactions. Written educational materials and patient instructions outlining all of the above were given.  Laura Harold, MD 07/22/22 8:44 AM

## 2022-07-22 ENCOUNTER — Telehealth (INDEPENDENT_AMBULATORY_CARE_PROVIDER_SITE_OTHER): Payer: 59 | Admitting: Psychiatry

## 2022-07-22 DIAGNOSIS — G43019 Migraine without aura, intractable, without status migrainosus: Secondary | ICD-10-CM

## 2022-07-22 NOTE — Patient Instructions (Signed)
[email or web address]

## 2022-07-25 DIAGNOSIS — H26492 Other secondary cataract, left eye: Secondary | ICD-10-CM | POA: Diagnosis not present

## 2022-09-19 DIAGNOSIS — M810 Age-related osteoporosis without current pathological fracture: Secondary | ICD-10-CM | POA: Diagnosis not present

## 2022-09-19 DIAGNOSIS — I959 Hypotension, unspecified: Secondary | ICD-10-CM | POA: Diagnosis not present

## 2022-09-19 DIAGNOSIS — E039 Hypothyroidism, unspecified: Secondary | ICD-10-CM | POA: Diagnosis not present

## 2022-09-19 DIAGNOSIS — E559 Vitamin D deficiency, unspecified: Secondary | ICD-10-CM | POA: Diagnosis not present

## 2022-09-26 ENCOUNTER — Ambulatory Visit (INDEPENDENT_AMBULATORY_CARE_PROVIDER_SITE_OTHER): Payer: 59 | Admitting: Obstetrics and Gynecology

## 2022-09-26 ENCOUNTER — Encounter: Payer: Self-pay | Admitting: Obstetrics and Gynecology

## 2022-09-26 VITALS — BP 95/60 | HR 76 | Ht 65.0 in | Wt 138.0 lb

## 2022-09-26 DIAGNOSIS — Z01419 Encounter for gynecological examination (general) (routine) without abnormal findings: Secondary | ICD-10-CM | POA: Insufficient documentation

## 2022-09-26 NOTE — Progress Notes (Signed)
Laura Byrd is a 62 y.o. G10P0010 female here for a routine annual gynecologic exam.  Current complaints: None.   Denies abnormal vaginal bleeding, discharge, pelvic pain,  or other gynecologic concerns.   Not sexual active and denies any menopausal Sx   Gynecologic History No LMP recorded. Patient is postmenopausal. Contraception: post menopausal status Last Pap: 2023. Results were: normal Last mammogram: 2023. Results were: normal  Obstetric History OB History  Gravida Para Term Preterm AB Living  1       1    SAB IAB Ectopic Multiple Live Births  1            # Outcome Date GA Lbr Len/2nd Weight Sex Delivery Anes PTL Lv  1 SAB             Past Medical History:  Diagnosis Date   Abnormal Pap smear of cervix    Had two abnormal paps in 2015 and 2019, normal since   B12 deficiency    Dyslipidemia    Eosinophilic esophagitis    Hashimoto's thyroiditis    History of vitamin D deficiency    Iodine deficiency    Migraines    MTHFR mutation    Pernicious anemia    Vitamin D3 deficiency     Past Surgical History:  Procedure Laterality Date   CATARACT EXTRACTION     TONSILLECTOMY     TONSILLECTOMY AND ADENOIDECTOMY     pt doesn't think her adenoids were removed    Current Outpatient Medications on File Prior to Visit  Medication Sig Dispense Refill   gabapentin (NEURONTIN) 100 MG capsule Take 100 mg by mouth 3 (three) times daily.     levothyroxine (SYNTHROID) 50 MCG tablet Take 25-50 mcg by mouth daily. Takes 1 tablet on 1 day and 1/2 tablet on other 6 days of the week     liothyronine (CYTOMEL) 5 MCG tablet Take 5-10 mcg by mouth daily. 7.5 mcg on 4 days a week and 5 mcg on 3 days of the week     SUMAtriptan (IMITREX) 100 MG tablet Take 100 mg by mouth as directed.     Ubrogepant (UBRELVY) 100 MG TABS Take 100 mg by mouth as needed (Take one pill at onset of migraine. May repeat a dose in 2 hours if headache persists. (Do NOT exceed more than 2 tablets in 24 hrs)).  16 tablet 5   No current facility-administered medications on file prior to visit.    Allergies  Allergen Reactions   Other     Milk, nuts, egg white, wheat   Penicillins Hives    Social History   Socioeconomic History   Marital status: Single    Spouse name: Not on file   Number of children: Not on file   Years of education: Not on file   Highest education level: Not on file  Occupational History   Not on file  Tobacco Use   Smoking status: Former    Types: Cigarettes    Quit date: 05/06/1987    Years since quitting: 35.4   Smokeless tobacco: Never  Vaping Use   Vaping Use: Never used  Substance and Sexual Activity   Alcohol use: Not Currently   Drug use: Never   Sexual activity: Not Currently  Other Topics Concern   Not on file  Social History Narrative   Lives alone with her three dogs   Right handed   Caffeine: 2 cups/day of coffee   Social Determinants of Health  Financial Resource Strain: Not on file  Food Insecurity: Not on file  Transportation Needs: Not on file  Physical Activity: Not on file  Stress: Not on file  Social Connections: Not on file  Intimate Partner Violence: Not on file    Family History  Problem Relation Age of Onset   Dementia Father        Lewy body   Breast cancer Sister    Asthma Sister    Heart murmur Sister    Cancer Brother        bile duct; cholangiocarcinoma   Migraines Niece     The following portions of the patient's history were reviewed and updated as appropriate: allergies, current medications, past family history, past medical history, past social history, past surgical history and problem list.  Review of Systems Pertinent items noted in HPI and remainder of comprehensive ROS otherwise negative.   Objective:  BP 95/60   Pulse 76   Ht 5\' 5"  (1.651 m)   Wt 138 lb (62.6 kg)   BMI 22.96 kg/m  Chaperone present CONSTITUTIONAL: Well-developed, well-nourished female in no acute distress.  HENT:   Normocephalic, atraumatic, External right and left ear normal. Oropharynx is clear and moist EYES: Conjunctivae and EOM are normal. Pupils are equal, round, and reactive to light. No scleral icterus.  NECK: Normal range of motion, supple, no masses.  Normal thyroid.  SKIN: Skin is warm and dry. No rash noted. Not diaphoretic. No erythema. No pallor. NEUROLGIC: Alert and oriented to person, place, and time. Normal reflexes, muscle tone coordination. No cranial nerve deficit noted. PSYCHIATRIC: Normal mood and affect. Normal behavior. Normal judgment and thought content. CARDIOVASCULAR: Normal heart rate noted, regular rhythm RESPIRATORY: Clear to auscultation bilaterally. Effort and breath sounds normal, no problems with respiration noted. BREASTS: Symmetric in size. No masses, skin changes, nipple drainage, or lymphadenopathy. ABDOMEN: Soft, normal bowel sounds, no distention noted.  No tenderness, rebound or guarding.  PELVIC: Deferred MUSCULOSKELETAL: Normal range of motion. No tenderness.  No cyanosis, clubbing, or edema.  2+ distal pulses.   Assessment:  Annual gynecologic examination    Plan:  Pap smear testing reviewed with pt. As pt has had 3 consecutive yrs of neg cytology and HPV, yearly pap smear are not indicated. Pt is agreeable  Mammogram scheduled Routine preventative health maintenance measures emphasized. Please refer to After Visit Summary for other counseling recommendations.    Hermina Staggers, MD, FACOG Attending Obstetrician & Gynecologist Center for [facility], [facility]

## 2022-09-26 NOTE — Progress Notes (Signed)
62 y.o GYN presents for AEX/PAP.

## 2022-10-01 ENCOUNTER — Other Ambulatory Visit: Payer: Self-pay | Admitting: Internal Medicine

## 2022-10-01 DIAGNOSIS — Z1231 Encounter for screening mammogram for malignant neoplasm of breast: Secondary | ICD-10-CM

## 2022-12-18 DIAGNOSIS — E039 Hypothyroidism, unspecified: Secondary | ICD-10-CM | POA: Diagnosis not present

## 2022-12-18 DIAGNOSIS — E538 Deficiency of other specified B group vitamins: Secondary | ICD-10-CM | POA: Diagnosis not present

## 2022-12-18 DIAGNOSIS — Z0000 Encounter for general adult medical examination without abnormal findings: Secondary | ICD-10-CM | POA: Diagnosis not present

## 2022-12-18 DIAGNOSIS — E559 Vitamin D deficiency, unspecified: Secondary | ICD-10-CM | POA: Diagnosis not present

## 2023-02-20 ENCOUNTER — Other Ambulatory Visit: Payer: Self-pay | Admitting: Internal Medicine

## 2023-02-20 ENCOUNTER — Ambulatory Visit
Admission: RE | Admit: 2023-02-20 | Discharge: 2023-02-20 | Disposition: A | Discharge status: Home / Self Care | Payer: 59 | Source: Ambulatory Visit

## 2023-02-20 DIAGNOSIS — Z1231 Encounter for screening mammogram for malignant neoplasm of breast: Secondary | ICD-10-CM

## 2023-02-26 IMAGING — MG MM DIGITAL SCREENING BILAT W/ TOMO AND CAD
8 series · 9 of 24 positions shown · non-contrast
Comparison: Previous exam(s).

CLINICAL DATA: Screening.

EXAM:
DIGITAL SCREENING BILATERAL MAMMOGRAM WITH TOMOSYNTHESIS AND CAD
TECHNIQUE: Bilateral screening digital craniocaudal and mediolateral oblique
mammograms were obtained. Bilateral screening digital breast
tomosynthesis was performed. The images were evaluated with
computer-aided detection.

[L MLO synth-2D]
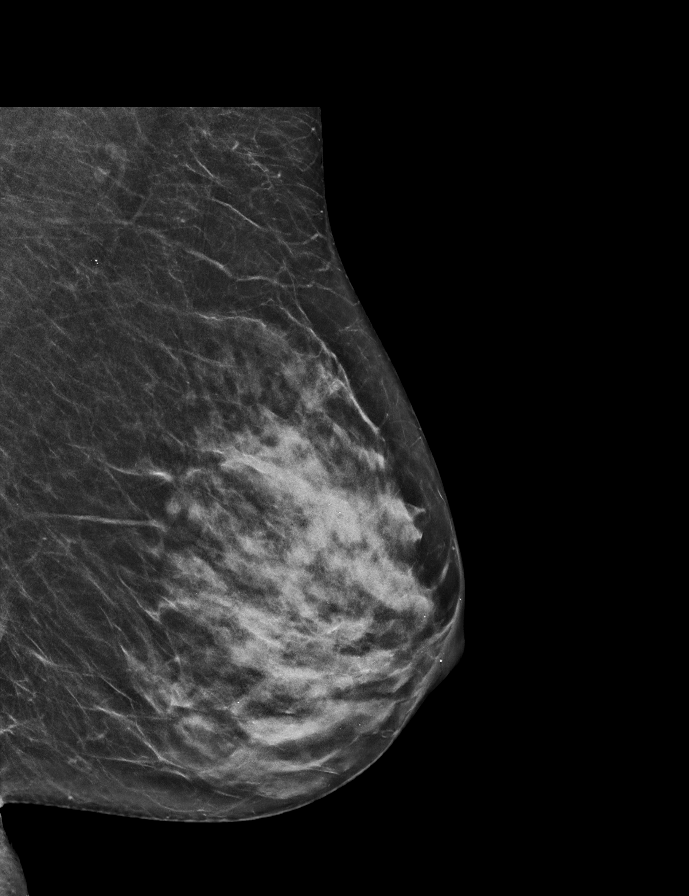

[L CC synth-2D]
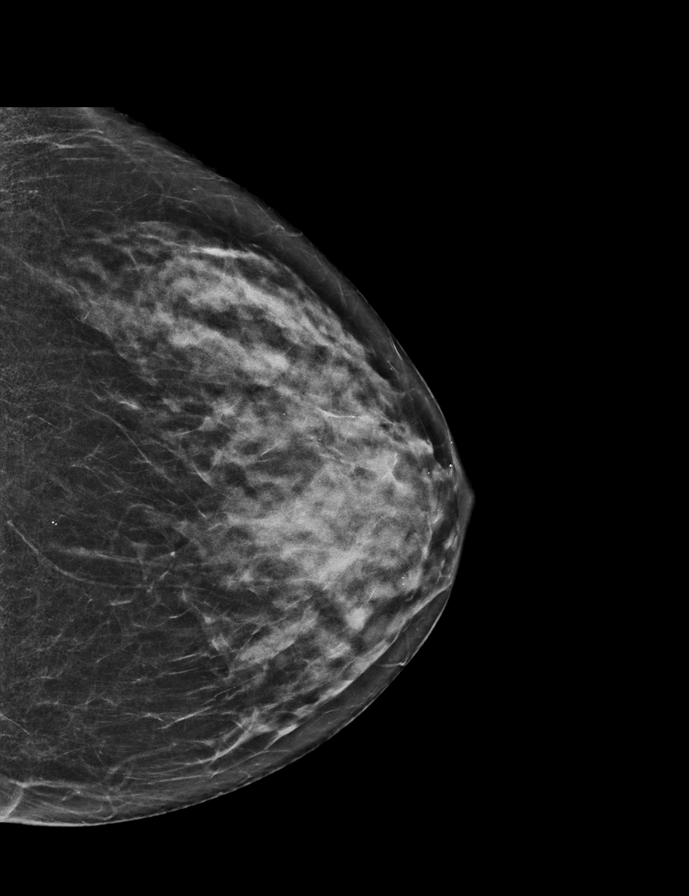

[R CC synth-2D]
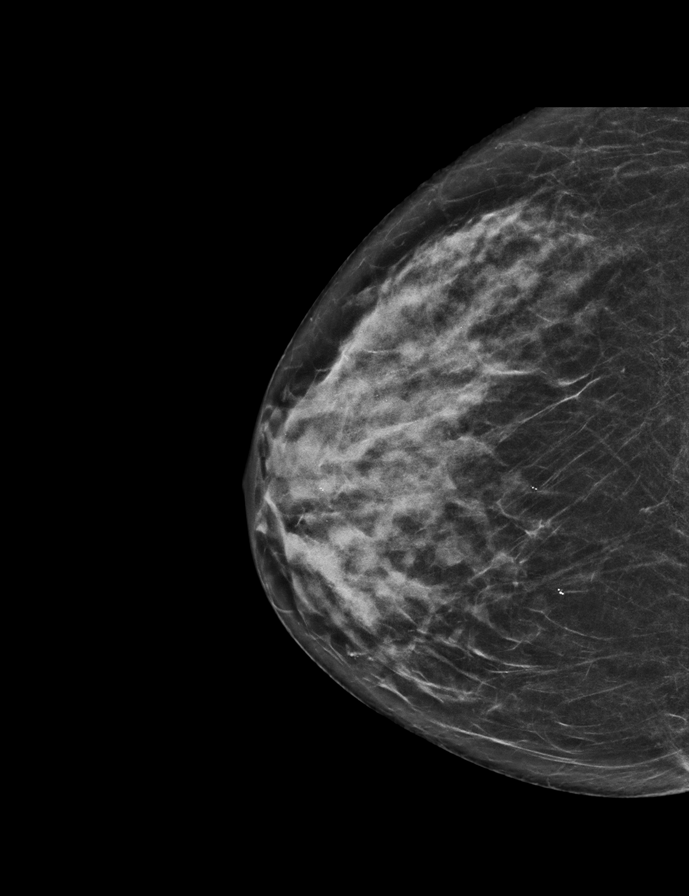

[R MLO synth-2D]
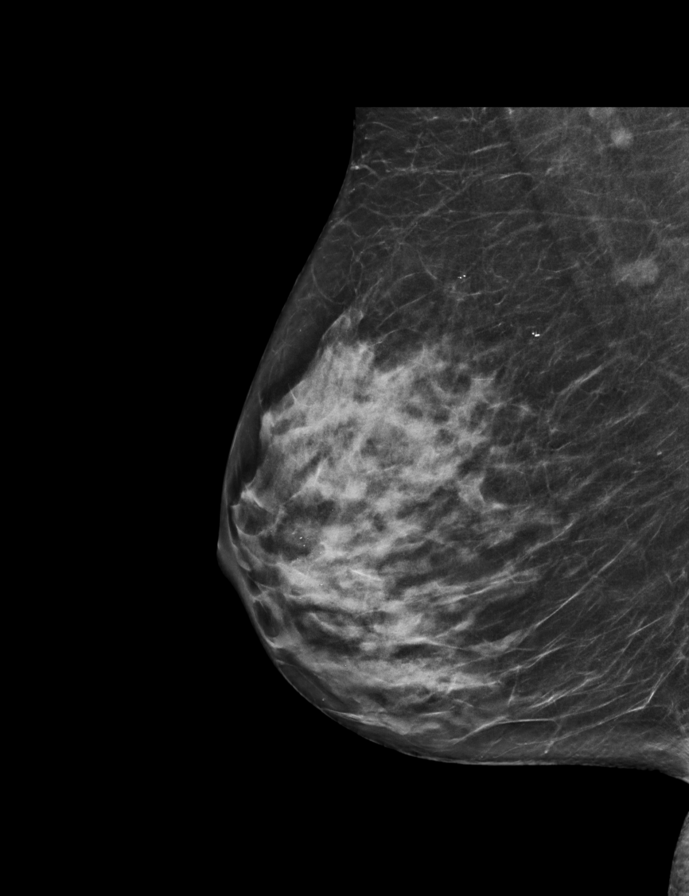

[L MLO tomo · 2 of 60 frames shown]
[frame 20/60]
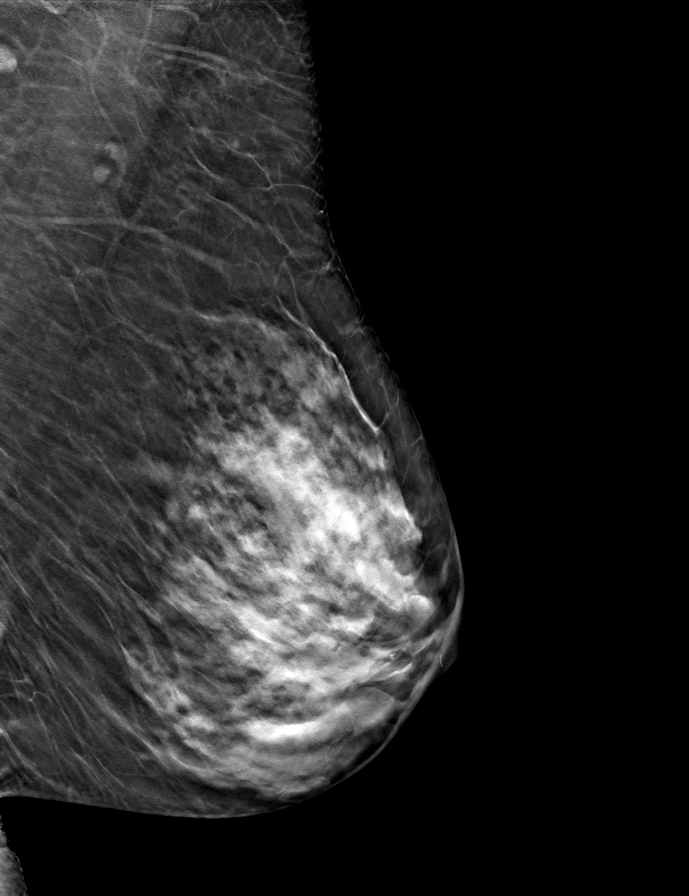
[frame 31/60]
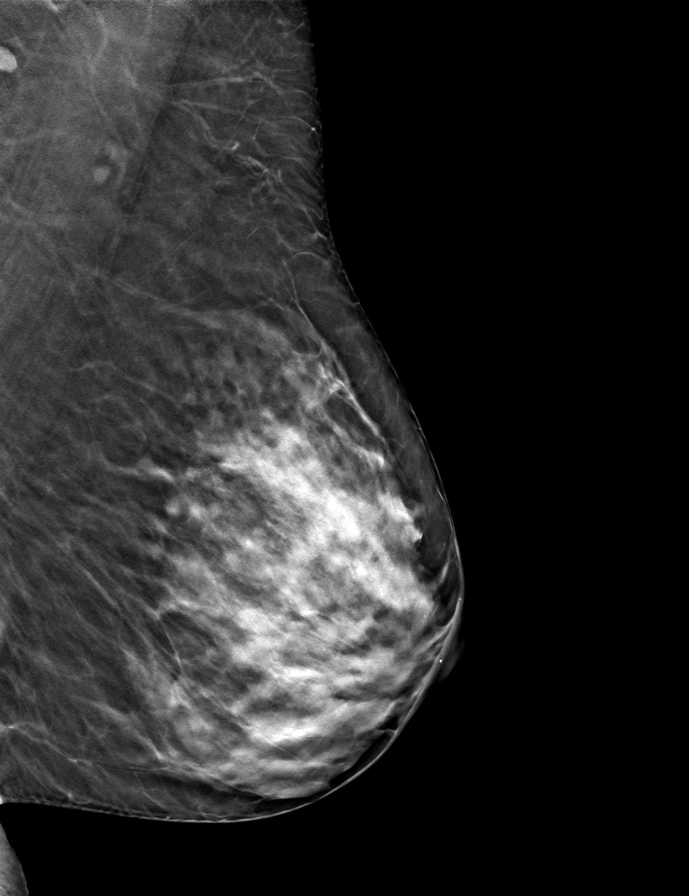

[L CC tomo · tomo slice 27/54.0]
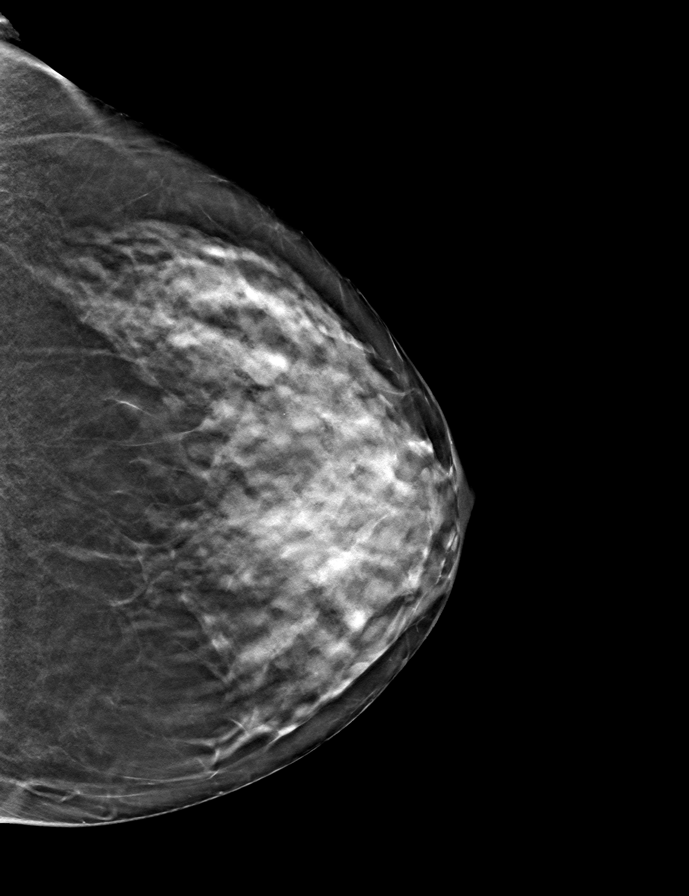

[R CC tomo · tomo slice 29/56.0]
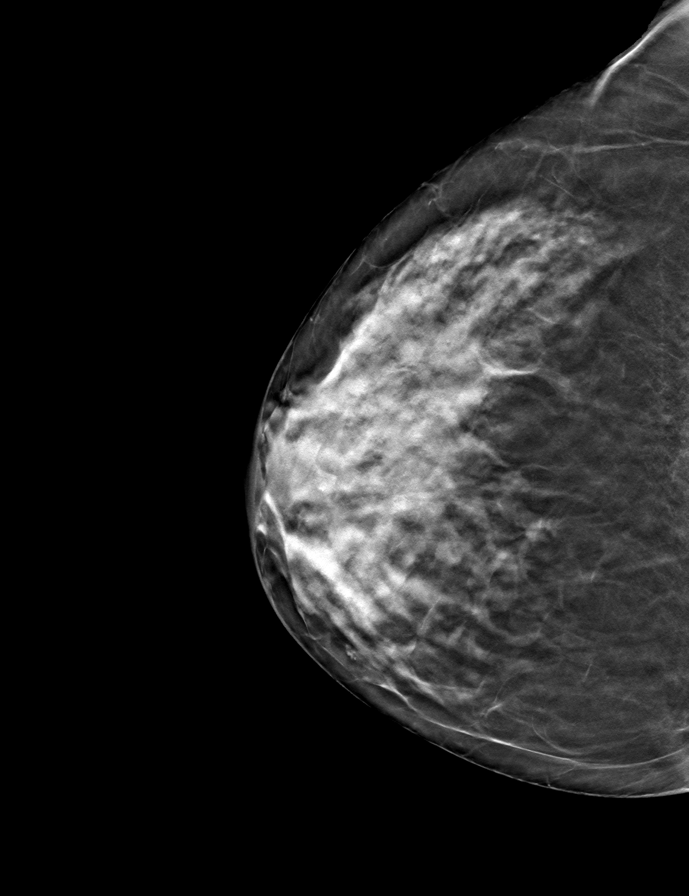

[R MLO tomo · tomo slice 31/61.0]
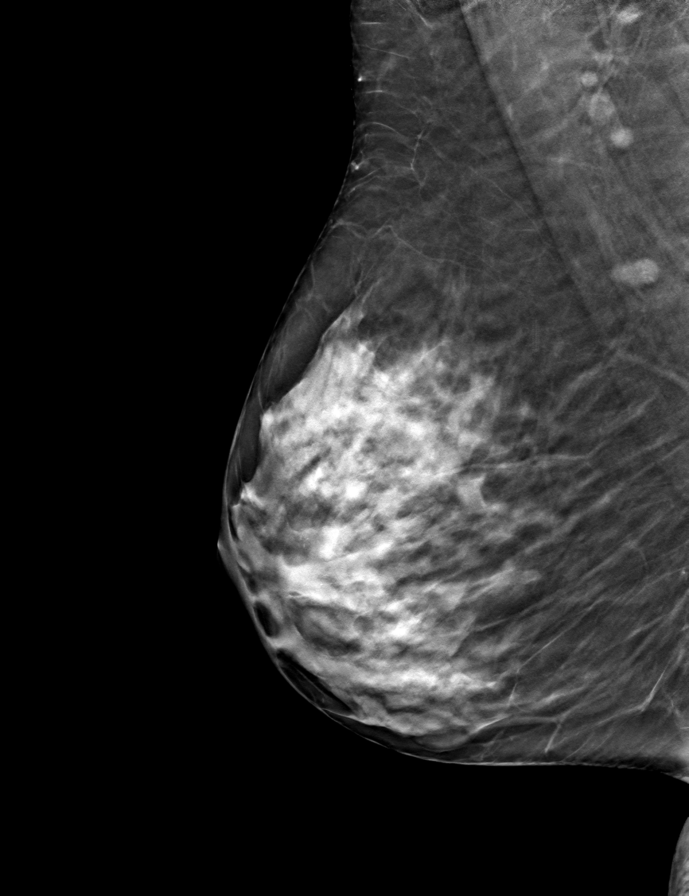

[9 of 24 positions shown; findings below may reference images not displayed]

ACR Breast Density Category c: The breast tissue is heterogeneously
dense, which may obscure small masses.
FINDINGS: There are no findings suspicious for malignancy.
IMPRESSION: No mammographic evidence of malignancy. A result letter of this
screening mammogram will be mailed directly to the patient.

RECOMMENDATION:
Screening mammogram in one year. (Code:Q3-W-BC3)

BI-RADS CATEGORY  1: Negative.

## 2023-04-01 ENCOUNTER — Other Ambulatory Visit: Payer: Self-pay | Admitting: Medical Genetics

## 2023-04-08 ENCOUNTER — Other Ambulatory Visit: Payer: Self-pay | Admitting: Medical Genetics

## 2023-04-16 DIAGNOSIS — M810 Age-related osteoporosis without current pathological fracture: Secondary | ICD-10-CM | POA: Diagnosis not present

## 2023-04-16 DIAGNOSIS — E039 Hypothyroidism, unspecified: Secondary | ICD-10-CM | POA: Diagnosis not present

## 2023-04-16 DIAGNOSIS — K200 Eosinophilic esophagitis: Secondary | ICD-10-CM | POA: Diagnosis not present

## 2023-05-01 ENCOUNTER — Other Ambulatory Visit (HOSPITAL_COMMUNITY): Payer: Self-pay

## 2023-05-20 ENCOUNTER — Other Ambulatory Visit (HOSPITAL_COMMUNITY): Payer: Self-pay

## 2023-05-29 ENCOUNTER — Other Ambulatory Visit (HOSPITAL_COMMUNITY): Payer: Self-pay

## 2023-05-29 DIAGNOSIS — E559 Vitamin D deficiency, unspecified: Secondary | ICD-10-CM | POA: Diagnosis not present

## 2023-05-29 DIAGNOSIS — M810 Age-related osteoporosis without current pathological fracture: Secondary | ICD-10-CM | POA: Diagnosis not present

## 2023-05-29 DIAGNOSIS — E039 Hypothyroidism, unspecified: Secondary | ICD-10-CM | POA: Diagnosis not present

## 2023-06-05 ENCOUNTER — Other Ambulatory Visit (HOSPITAL_COMMUNITY): Payer: Self-pay

## 2023-07-15 ENCOUNTER — Other Ambulatory Visit: Payer: Self-pay | Admitting: Internal Medicine

## 2023-07-15 ENCOUNTER — Telehealth: Payer: Self-pay

## 2023-07-15 DIAGNOSIS — M810 Age-related osteoporosis without current pathological fracture: Secondary | ICD-10-CM | POA: Insufficient documentation

## 2023-07-15 NOTE — Telephone Encounter (Signed)
 Auth Submission: no auth needed Site of care: Site of care: [facility] Payer: [employer] Medication & CPT/J Code(s) submitted: Reclast (Zolendronic acid) [id number] Route of submission (phone, fax, portal): portal Phone # Fax # Auth type: Buy/Bill PB Units/visits requested: 1 dose, 5mg  Reference number:  Approval from: 07/15/23 to 05/04/24

## 2023-07-22 ENCOUNTER — Encounter: Payer: Self-pay | Admitting: Diagnostic Neuroimaging

## 2023-07-22 ENCOUNTER — Telehealth: Payer: 59 | Admitting: Psychiatry

## 2023-07-22 ENCOUNTER — Telehealth (INDEPENDENT_AMBULATORY_CARE_PROVIDER_SITE_OTHER): Payer: 59 | Admitting: Diagnostic Neuroimaging

## 2023-07-22 DIAGNOSIS — G43009 Migraine without aura, not intractable, without status migrainosus: Secondary | ICD-10-CM | POA: Diagnosis not present

## 2023-07-22 MED ORDER — UBRELVY 100 MG PO TABS: 100.0000 mg | ORAL_TABLET | ORAL | 12 refills | Status: AC | PRN

## 2023-07-22 NOTE — Patient Instructions (Signed)
 Migraine without aura  MIGRAINE PREVENTION  LIFESTYLE CHANGES -Stop or avoid smoking -Decrease or avoid caffeine / alcohol -Eat and sleep on a regular schedule -Exercise several times per week  MIGRAINE RESCUE  - ibuprofen, tylenol as needed - sumatriptan 100mg  as needed for breakthrough headache; may repeat x 1 after 2 hours; max 2 tabs per day or 8 per month - ubrogepant Bernita Raisin) 100mg  as needed for breakthrough headache; may repeat x 1 after 2 hours; max 2 tabs per day or 8 per month

## 2023-07-22 NOTE — Progress Notes (Signed)
 [facility]  PATIENT: Laura Byrd DOB: 1960-02-19  REFERRING CLINICIAN: O'Buch, Greta, PA-C HISTORY FROM: patient  REASON FOR VISIT: new consult   HISTORICAL  CHIEF COMPLAINT:  Chief Complaint  Patient presents with   Migraine    HISTORY OF PRESENT ILLNESS:   UPDATE (07/22/23, VRP): Since last visit, doing well. Bernita Raisin working, but not covered by insurance now, but sumatriptan is working. Stopped eating a type of ice cream, and now HA are improved. Having ~0-1 migraine per week (previously 2-3 per week).   UPDATE (07/22/22, Chima): "Headaches are slightly better than her last visit. She is averaging 1-2 migraines per week. Bernita Raisin works well for rescue, but she has not needed to take it frequently as her migraines have not been very severe. Magnesium causes diarrhea so she cannot take this every day, but she takes 200 mg 3 days per week. She also takes a B complex vitamin. She has weaned off of gabapentin. "  PRIOR HPI (01/17/22, Chima): "Medical co-morbidities: Hashimoto's, B12 deficiency, Fuch's disease   The patient presents for evaluation of headaches which began in 2011. She averages 1-2 migraines per week. They are described as right retro-orbital aching with associated photophobia, phonophobia, and nausea. They can last 3-4 days without medication. Takes Imitrex 100 mg PRN for rescue, which helps but she will sometimes run out of medication. She will sometimes have to repeat a dose after 2 hours. Takes gabapentin 100 mg TID for headache prevention."   REVIEW OF SYSTEMS: Full 14 system review of systems performed and negative with exception of: as per HPI.  ALLERGIES: Allergies  Allergen Reactions   Other     Milk, nuts, egg white, wheat   Penicillins Hives    HOME MEDICATIONS: Outpatient Medications Prior to Visit  Medication Sig Dispense Refill   levothyroxine (SYNTHROID) 50 MCG tablet Take 25-50 mcg by mouth daily. Takes 1 tablet on 1 day and  1/2 tablet on other 6 days of the week     liothyronine (CYTOMEL) 5 MCG tablet Take 5-10 mcg by mouth daily. 7.5 mcg on 4 days a week and 5 mcg on 3 days of the week     SUMAtriptan (IMITREX) 100 MG tablet Take 100 mg by mouth as directed.     Ubrogepant (UBRELVY) 100 MG TABS Take 100 mg by mouth as needed (Take one pill at onset of migraine. May repeat a dose in 2 hours if headache persists. (Do NOT exceed more than 2 tablets in 24 hrs)). 16 tablet 5   No facility-administered medications prior to visit.      PHYSICAL EXAM  Video visit    DIAGNOSTIC DATA (LABS, IMAGING, TESTING) - I reviewed patient records, labs, notes, testing and imaging myself where available.  No results found for: "WBC", "HGB", "HCT", "MCV", "PLT" No results found for: "NA", "K", "CL", "CO2", "GLUCOSE", "BUN", "CREATININE", "CALCIUM", "PROT", "ALBUMIN", "AST", "ALT", "ALKPHOS", "BILITOT", "GFRNONAA", "GFRAA" No results found for: "CHOL", "HDL", "LDLCALC", "LDLDIRECT", "TRIG", "CHOLHDL" No results found for: "HGBA1C" No results found for: "VITAMINB12" No results found for: "TSH"     ASSESSMENT AND PLAN  63 y.o. year old female here with:   Dx:  1. Migraine without aura and without status migrainosus, not intractable     PLAN:  Migraine without aura  MIGRAINE PREVENTION  LIFESTYLE CHANGES -Stop or avoid smoking -Decrease or avoid caffeine / alcohol -Eat and sleep on a regular schedule -Exercise several times per week  MIGRAINE RESCUE  - ibuprofen,  tylenol as needed - sumatriptan 100mg  as needed for breakthrough headache; may repeat x 1 after 2 hours; max 2 tabs per day or 8 per month - ubrogepant Bernita Raisin) 100mg  as needed for breakthrough headache; may repeat x 1 after 2 hours; max 2 tabs per day or 8 per month  Meds ordered this encounter  Medications   Ubrogepant (UBRELVY) 100 MG TABS    Sig: Take 1 tablet (100 mg total) by mouth as needed (Take one pill at onset of migraine. May  repeat a dose in 2 hours if headache persists. (Do NOT exceed more than 2 tablets in 24 hrs)).    Dispense:  8 tablet    Refill:  12   Return in about 1 year (around 07/21/2024) for MyChart visit (15 min).  Virtual Visit via Video Note  I connected with Laura Byrd on 07/22/23 at  9:00 AM EDT by a video enabled telemedicine application and verified that I am speaking with the correct person using two identifiers.   I discussed the limitations of evaluation and management by telemedicine and the availability of in person appointments. The patient expressed understanding and agreed to proceed.  Patient is at home and I am at the office.   I spent 15 minutes of face-to-face and non-face-to-face time with patient.  This included previsit chart review, lab review, study review, order entry, electronic health record documentation, patient education.      Suanne Marker, MD 07/22/2023, 9:17 AM Certified in Neurology, Neurophysiology and Neuroimaging  [facility] [address], [address] [place], [place] [address] [phone]

## 2023-07-24 ENCOUNTER — Other Ambulatory Visit (HOSPITAL_COMMUNITY)
Admission: RE | Admit: 2023-07-24 | Discharge: 2023-07-24 | Disposition: A | Discharge status: Home / Self Care | Payer: Self-pay | Source: Ambulatory Visit | Attending: Oncology | Admitting: Oncology

## 2023-08-04 LAB — GENECONNECT MOLECULAR SCREEN: Genetic Analysis Overall Interpretation: NEGATIVE

## 2023-08-12 ENCOUNTER — Other Ambulatory Visit (HOSPITAL_COMMUNITY): Payer: Self-pay

## 2023-08-12 ENCOUNTER — Telehealth: Payer: Self-pay | Admitting: Pharmacy Technician

## 2023-08-12 NOTE — Telephone Encounter (Signed)
 Pharmacy Patient Advocate Encounter  Received notification from [facility] that Prior Authorization for Ubrelvy 100MG  tablets has been APPROVED from 08/12/2023 to 08/11/2024   PA #/Case ID/Reference #: [id number] Key: [id number]

## 2023-08-14 ENCOUNTER — Ambulatory Visit (INDEPENDENT_AMBULATORY_CARE_PROVIDER_SITE_OTHER)

## 2023-08-14 VITALS — BP 106/67 | HR 68 | Temp 97.9°F | Resp 14 | Ht 65.0 in | Wt 138.8 lb

## 2023-08-14 DIAGNOSIS — M810 Age-related osteoporosis without current pathological fracture: Secondary | ICD-10-CM

## 2023-08-14 MED ORDER — DIPHENHYDRAMINE HCL 25 MG PO CAPS
25.0000 mg | ORAL_CAPSULE | Freq: Once | ORAL | Status: AC
  Administered 2023-08-14: 25 mg via ORAL
  Filled 2023-08-14: qty 1

## 2023-08-14 MED ORDER — SODIUM CHLORIDE 0.9 % IV SOLN: INTRAVENOUS | Status: DC

## 2023-08-14 MED ORDER — ACETAMINOPHEN 325 MG PO TABS
650.0000 mg | ORAL_TABLET | Freq: Once | ORAL | Status: AC
  Administered 2023-08-14: 650 mg via ORAL
  Filled 2023-08-14: qty 2

## 2023-08-14 MED ORDER — ZOLEDRONIC ACID 5 MG/100ML IV SOLN
5.0000 mg | Freq: Once | INTRAVENOUS | Status: AC
  Administered 2023-08-14: 5 mg via INTRAVENOUS
  Filled 2023-08-14: qty 100

## 2023-08-14 NOTE — Progress Notes (Signed)
 Diagnosis: Osteoporosis  Provider:  Chilton Greathouse MD  Procedure: IV Infusion  IV Type: Peripheral, IV Location: L Hand  Reclast (Zolendronic Acid), Dose: 5 mg  Infusion Start Time: 1222  Infusion Stop Time: 1252  Post Infusion IV Care: Observation period completed and Peripheral IV Discontinued  Discharge: Condition: Good, Destination: Home . AVS Declined  Performed by:  Loney Hering, LPN

## 2023-08-28 DIAGNOSIS — L821 Other seborrheic keratosis: Secondary | ICD-10-CM | POA: Diagnosis not present

## 2023-08-28 DIAGNOSIS — L814 Other melanin hyperpigmentation: Secondary | ICD-10-CM | POA: Diagnosis not present

## 2023-08-28 DIAGNOSIS — D225 Melanocytic nevi of trunk: Secondary | ICD-10-CM | POA: Diagnosis not present

## 2023-08-28 DIAGNOSIS — L720 Epidermal cyst: Secondary | ICD-10-CM | POA: Diagnosis not present

## 2023-10-16 DIAGNOSIS — K200 Eosinophilic esophagitis: Secondary | ICD-10-CM | POA: Diagnosis not present

## 2023-10-16 DIAGNOSIS — E039 Hypothyroidism, unspecified: Secondary | ICD-10-CM | POA: Diagnosis not present

## 2023-10-16 DIAGNOSIS — E559 Vitamin D deficiency, unspecified: Secondary | ICD-10-CM | POA: Diagnosis not present

## 2023-10-16 DIAGNOSIS — M810 Age-related osteoporosis without current pathological fracture: Secondary | ICD-10-CM | POA: Diagnosis not present

## 2023-12-23 DIAGNOSIS — E039 Hypothyroidism, unspecified: Secondary | ICD-10-CM | POA: Diagnosis not present

## 2023-12-23 DIAGNOSIS — E538 Deficiency of other specified B group vitamins: Secondary | ICD-10-CM | POA: Diagnosis not present

## 2023-12-23 DIAGNOSIS — E559 Vitamin D deficiency, unspecified: Secondary | ICD-10-CM | POA: Diagnosis not present

## 2023-12-23 DIAGNOSIS — Z6823 Body mass index (BMI) 23.0-23.9, adult: Secondary | ICD-10-CM | POA: Diagnosis not present

## 2023-12-23 DIAGNOSIS — Z0000 Encounter for general adult medical examination without abnormal findings: Secondary | ICD-10-CM | POA: Diagnosis not present

## 2024-02-16 DIAGNOSIS — Z01818 Encounter for other preprocedural examination: Secondary | ICD-10-CM | POA: Diagnosis not present

## 2024-03-15 DIAGNOSIS — K222 Esophageal obstruction: Secondary | ICD-10-CM | POA: Diagnosis not present

## 2024-03-15 DIAGNOSIS — K2210 Ulcer of esophagus without bleeding: Secondary | ICD-10-CM | POA: Diagnosis not present

## 2024-03-15 DIAGNOSIS — Z860100 Personal history of colon polyps, unspecified: Secondary | ICD-10-CM | POA: Diagnosis not present

## 2024-03-15 DIAGNOSIS — R1310 Dysphagia, unspecified: Secondary | ICD-10-CM | POA: Diagnosis not present

## 2024-03-25 DIAGNOSIS — Z1231 Encounter for screening mammogram for malignant neoplasm of breast: Secondary | ICD-10-CM | POA: Diagnosis not present

## 2024-03-25 DIAGNOSIS — M8589 Other specified disorders of bone density and structure, multiple sites: Secondary | ICD-10-CM | POA: Diagnosis not present

## 2024-03-25 DIAGNOSIS — Z780 Asymptomatic menopausal state: Secondary | ICD-10-CM | POA: Diagnosis not present

## 2024-05-12 ENCOUNTER — Telehealth: Payer: Self-pay | Admitting: *Deleted

## 2024-05-12 NOTE — Telephone Encounter (Signed)
 I call pt not able to leave message. Pt office notes faxed to PA MALVA Payer, [place]   office.
